# Patient Record
Sex: Male | Born: 1955 | Race: White | Hispanic: No | Marital: Single | Smoking: Current some day smoker
Health system: Southern US, Community
[De-identification: ages and names within clinical notes are randomized; demographics above are authoritative.]

## PROBLEM LIST (undated history)

## (undated) DIAGNOSIS — I10 Essential (primary) hypertension: Secondary | ICD-10-CM

## (undated) HISTORY — DX: Essential (primary) hypertension: I10

---

## 2020-12-29 ENCOUNTER — Ambulatory Visit (INDEPENDENT_AMBULATORY_CARE_PROVIDER_SITE_OTHER): Payer: 59 | Admitting: Family Medicine

## 2020-12-29 ENCOUNTER — Encounter: Payer: Self-pay | Admitting: Family Medicine

## 2020-12-29 ENCOUNTER — Other Ambulatory Visit: Payer: Self-pay

## 2020-12-29 VITALS — BP 133/89 | HR 82 | Temp 98.9°F | Resp 18 | Ht 73.03 in | Wt 275.2 lb

## 2020-12-29 DIAGNOSIS — E6609 Other obesity due to excess calories: Secondary | ICD-10-CM

## 2020-12-29 DIAGNOSIS — R739 Hyperglycemia, unspecified: Secondary | ICD-10-CM | POA: Diagnosis not present

## 2020-12-29 DIAGNOSIS — Z6836 Body mass index (BMI) 36.0-36.9, adult: Secondary | ICD-10-CM

## 2020-12-29 DIAGNOSIS — M25562 Pain in left knee: Secondary | ICD-10-CM | POA: Diagnosis not present

## 2020-12-29 DIAGNOSIS — Z7689 Persons encountering health services in other specified circumstances: Secondary | ICD-10-CM

## 2020-12-29 LAB — GLUCOSE, POCT (MANUAL RESULT ENTRY): POC Glucose: 109 mg/dl — AB (ref 70–99)

## 2020-12-29 LAB — POCT GLYCOSYLATED HEMOGLOBIN (HGB A1C): Hemoglobin A1C: 6.1 % — AB (ref 4.0–5.6)

## 2020-12-29 NOTE — Progress Notes (Signed)
Pt presents to establish care reports that he has not been to PCP in awhile, he has been having left knee intermittent pain req referral to Orthopedic

## 2020-12-29 NOTE — Progress Notes (Signed)
New Patient Office Visit  Subjective:  Patient ID: Casey Beasley, male    DOB: April 02, 1956  Age: 65 y.o. MRN: [id number]  CC:  Chief Complaint  Patient presents with   Establish Care    HPI Casey Beasley presents for to reestablish care.  Patient reports history of hyperglycemia.  Patient reports also that he tweaked his left knee playing golf several weeks ago.  Patient would like referral to Ortho as his left knee had ACL surgery over 20 years ago.  Past Medical History:  Diagnosis Date   Hypertension     History reviewed. No pertinent surgical history.  History reviewed. No pertinent family history.  Social History   Socioeconomic History   Marital status: Single    Spouse name: Not on file   Number of children: Not on file   Years of education: Not on file   Highest education level: Not on file  Occupational History   Not on file  Tobacco Use   Smoking status: Some Days    Types: Cigars   Smokeless tobacco: Never  Vaping Use   Vaping Use: Never used  Substance and Sexual Activity   Alcohol use: Yes    Alcohol/week: 2.0 standard drinks    Types: 1 Cans of beer, 1 Shots of liquor per week    Comment: moderately   Drug use: Never   Sexual activity: Not on file  Other Topics Concern   Not on file  Social History Narrative   Not on file   Social Determinants of Health   Financial Resource Strain: Not on file  Food Insecurity: Not on file  Transportation Needs: Not on file  Physical Activity: Not on file  Stress: Not on file  Social Connections: Not on file  Intimate Partner Violence: Not on file    ROS Review of Systems  Cardiovascular: Negative.   Musculoskeletal:  Negative for gait problem.  All other systems reviewed and are negative.  Objective:   Today's Vitals: BP 133/89 (BP Location: Left Arm, Patient Position: Sitting, Cuff Size: Large)   Pulse 82   Temp 98.9 F (37.2 C)   Resp 18   Ht 6' 1.03" (1.855 m)   Wt 275 lb 3.2 oz  (124.8 kg)   SpO2 92%   BMI 36.28 kg/m   Physical Exam Vitals and nursing note reviewed.  Constitutional:      General: He is not in acute distress. Cardiovascular:     Rate and Rhythm: Normal rate and regular rhythm.  Pulmonary:     Effort: Pulmonary effort is normal.     Breath sounds: Normal breath sounds.  Abdominal:     Palpations: Abdomen is soft.     Tenderness: There is no abdominal tenderness.  Musculoskeletal:     Right lower leg: No edema.     Left lower leg: No edema.  Neurological:     General: No focal deficit present.     Mental Status: He is alert and oriented to person, place, and time.    Assessment & Plan:   1. Hyperglycemia A1c is at goal continue to monitor - POCT glucose (manual entry) - POCT glycosylated hemoglobin (Hb A1C)  2. Class 2 obesity due to excess calories without serious comorbidity with body mass index (BMI) of 36.0 to 36.9 in adult Discussed dietary and  activity options.  Goal for patient will be 5 pounds per month of weight loss and will monitor.  3. Left knee pain, unspecified chronicity Referral to  orthopedic surgery for further evaluation and management. - Ambulatory referral to Orthopedic Surgery  4. Encounter to establish care     No outpatient encounter medications on file as of 12/29/2020.   No facility-administered encounter medications on file as of 12/29/2020.    Follow-up: Return in about 6 months (around 07/01/2021) for physical.   Tommie Raymond, MD

## 2021-01-01 ENCOUNTER — Ambulatory Visit (INDEPENDENT_AMBULATORY_CARE_PROVIDER_SITE_OTHER): Payer: 59

## 2021-01-01 ENCOUNTER — Other Ambulatory Visit: Payer: Self-pay

## 2021-01-01 ENCOUNTER — Encounter: Payer: Self-pay | Admitting: Orthopedic Surgery

## 2021-01-01 ENCOUNTER — Ambulatory Visit (INDEPENDENT_AMBULATORY_CARE_PROVIDER_SITE_OTHER): Payer: 59 | Admitting: Physician Assistant

## 2021-01-01 DIAGNOSIS — M25562 Pain in left knee: Secondary | ICD-10-CM

## 2021-01-01 DIAGNOSIS — G8929 Other chronic pain: Secondary | ICD-10-CM | POA: Diagnosis not present

## 2021-01-01 NOTE — Progress Notes (Signed)
Office Visit Note   Patient: Casey Beasley           Date of Birth: 18-Sep-1955           MRN: [id number] Visit Date: 01/01/2021              Requested by: Georganna Skeans, MD [address] [place],  [place] [address] PCP: Georganna Skeans, MD  Chief Complaint  Patient presents with   Left Knee - Pain      HPI: Patient is a very pleasant active 65 year old gentleman with a 2-week history of left knee pain after twisting while playing golf.  He had his ACL he believes was 25 1985 but he had a reconstruction in 2000.  He was told at that time that he did have some arthritis in his knee.  He actually did fairly well he takes over-the-counter ibuprofen once daily.  He said 2-1/2 weeks ago he twisted his knee funny and had pain on the medial and the medial patella border.  He had a little swelling but not a significant amount of swelling he was worried because he felt his knee was a little bit weak wanted to make sure he had read torn his ACL  Assessment & Plan: Visit Diagnoses:  1. Chronic pain of left knee     Plan: Had a long discussion with the patient today.  He is much better than he was 2 weeks ago.  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  We talked about options down the line if he became more symptomatic.  I discussed with him close chain strengthening of his lower leg muscles.  He is going to be going to the gym.  He also wants to drop about 30 pounds I told him this was should be helpful.  If he had more pain we could consider a steroid injection.  I do not think he would benefit from viscosupplementation or arthroscopy given the advanced Faylene Million nature of his arthritis.  At some point he may need knee replacement  Follow-Up Instructions: No follow-ups on file.   Ortho Exam  Patient is alert, oriented, no adenopathy, well-dressed, normal affect, normal respiratory effort. Examination of his knee he has very little patellar mobility.  He has some  pain around the medial patellar border medial joint line less on the lateral joint line he has grinding with range of motion although his range of motion is quite good given the considerable amount of arthritis he has in his knee he has a good endpoint on anterior draw good medial and lateral stability  Imaging: XR KNEE 3 VIEW LEFT  Result Date: 01/01/2021 3 views of his left knee were obtained today.  They show advanced arthritic changes tricompartmental.  He has small areas of broken osteophyte formation complete loss of joint space on the medial side  No images are attached to the encounter.  Labs: Lab Results  Component Value Date   HGBA1C 6.1 (A) 12/29/2020     No results found for: ALBUMIN, PREALBUMIN, CBC  No results found for: MG No results found for: VD25OH  No results found for: PREALBUMIN No flowsheet data found.   There is no height or weight on file to calculate BMI.  Orders:  Orders Placed This Encounter  Procedures   XR KNEE 3 VIEW LEFT   No orders of the defined types were placed in this encounter.    Procedures: No procedures performed  Clinical Data:  No additional findings.  ROS:  All other systems negative, except as noted in the HPI. Review of Systems  Objective: Vital Signs: There were no vitals taken for this visit.  Specialty Comments:  No specialty comments available.  PMFS History: There are no problems to display for this patient.  Past Medical History:  Diagnosis Date   Hypertension     History reviewed. No pertinent family history.  History reviewed. No pertinent surgical history. Social History   Occupational History   Not on file  Tobacco Use   Smoking status: Some Days    Types: Cigars   Smokeless tobacco: Never  Vaping Use   Vaping Use: Never used  Substance and Sexual Activity   Alcohol use: Yes    Alcohol/week: 2.0 standard drinks    Types: 1 Cans of beer, 1 Shots of liquor per week    Comment: moderately    Drug use: Never   Sexual activity: Not on file

## 2021-02-25 ENCOUNTER — Ambulatory Visit
Admission: EM | Admit: 2021-02-25 | Discharge: 2021-02-25 | Disposition: A | Discharge status: Home / Self Care | Payer: 59 | Attending: Emergency Medicine | Admitting: Emergency Medicine

## 2021-02-25 ENCOUNTER — Ambulatory Visit (HOSPITAL_BASED_OUTPATIENT_CLINIC_OR_DEPARTMENT_OTHER)
Admission: RE | Admit: 2021-02-25 | Discharge: 2021-02-25 | Disposition: A | Discharge status: Home / Self Care | Payer: 59 | Source: Ambulatory Visit | Attending: Emergency Medicine | Admitting: Emergency Medicine

## 2021-02-25 ENCOUNTER — Other Ambulatory Visit: Payer: Self-pay

## 2021-02-25 DIAGNOSIS — Y9354 Activity, bowling: Secondary | ICD-10-CM | POA: Insufficient documentation

## 2021-02-25 DIAGNOSIS — Y929 Unspecified place or not applicable: Secondary | ICD-10-CM | POA: Insufficient documentation

## 2021-02-25 DIAGNOSIS — W19XXXA Unspecified fall, initial encounter: Secondary | ICD-10-CM | POA: Diagnosis not present

## 2021-02-25 DIAGNOSIS — Y9239 Other specified sports and athletic area as the place of occurrence of the external cause: Secondary | ICD-10-CM | POA: Insufficient documentation

## 2021-02-25 DIAGNOSIS — S96911A Strain of unspecified muscle and tendon at ankle and foot level, right foot, initial encounter: Secondary | ICD-10-CM

## 2021-02-25 DIAGNOSIS — M19071 Primary osteoarthritis, right ankle and foot: Secondary | ICD-10-CM | POA: Insufficient documentation

## 2021-02-25 MED ORDER — NAPROXEN 500 MG PO TABS: 500.0000 mg | ORAL_TABLET | Freq: Two times a day (BID) | ORAL | 0 refills | Status: AC

## 2021-02-25 NOTE — Discharge Instructions (Addendum)
I agree with your current course of care which is to avoid weightbearing when possible and to keep foot elevated with ice as able.  I also recommend that you continue ibuprofen or Aleve for your pain, this will also help reduce inflammation and swelling.  I have ordered you an x-ray of your foot, location address has been provided to the end of this document.  We will notify you of the results once they are received.  Thank you for visiting urgent care.

## 2021-02-25 NOTE — ED Provider Notes (Addendum)
[facility]    CSN: [id number] Arrival date & time: 02/25/21  0903      History   Chief Complaint Chief Complaint  Patient presents with   Foot Pain    HPI Casey Beasley is a 65 y.o. male.   Patient complains of experiencing a popping sensation in his right foot when he was bowling last night.  Patient states that today, he feels a burning sensation when he is weightbearing on right foot.  Patient states after getting home from bowling, he elevated his foot and placed ice on it and took some ibuprofen, states this provided some relief.  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  Patient rates his pain 2 out of 10 when not walking or weightbearing and 7 or 8 out of 10 when he is.  Patient states that in ninth grade, he broke his left ankle, states that the pain he is feeling today is similar to the pain he had then.  The history is provided by the patient.   Past Medical History:  Diagnosis Date   Hypertension     There are no problems to display for this patient.   History reviewed. No pertinent surgical history.     Home Medications    Prior to Admission medications   Medication Sig Start Date End Date Taking? Authorizing Provider  naproxen (NAPROSYN) 500 MG tablet Take 1 tablet (500 mg total) by mouth 2 (two) times daily with a meal. 02/25/21 03/27/21 Yes Theadora Rama Scales, PA-C    Family History History reviewed. No pertinent family history.  Social History Social History   Tobacco Use   Smoking status: Some Days    Types: Cigars   Smokeless tobacco: Never  Vaping Use   Vaping Use: Never used  Substance Use Topics   Alcohol use: Yes    Alcohol/week: 2.0 standard drinks    Types: 1 Cans of beer, 1 Shots of liquor per week    Comment: moderately   Drug use: Never     Allergies   Patient has no known allergies.   Review of  Systems Review of Systems Pertinent findings noted in history of present illness.    Physical Exam Triage Vital Signs ED Triage Vitals  Enc Vitals Group     BP      Pulse      Resp      Temp      Temp src      SpO2      Weight      Height      Head Circumference      Peak Flow      Pain Score      Pain Loc      Pain Edu?      Excl. in GC?    No data found.  Updated Vital Signs BP (!) 142/92 (BP Location: Left Arm)   Pulse 79   Temp 97.9 F (36.6 C) (Oral)   Resp 20   Wt 283 lb 9.6 oz (128.6 kg)   SpO2 97%   BMI 37.38 kg/m   Visual Acuity Right Eye Distance:   Left Eye Distance:   Bilateral Distance:    Right Eye Near:   Left Eye Near:    Bilateral Near:     Physical Exam Vitals and nursing note reviewed.  Constitutional:  Appearance: Normal appearance.  HENT:     Head: Normocephalic and atraumatic.  Eyes:     Conjunctiva/sclera: Conjunctivae normal.  Cardiovascular:     Rate and Rhythm: Normal rate and regular rhythm.     Pulses:          Dorsalis pedis pulses are 2+ on the right side.       Posterior tibial pulses are 2+ on the right side.     Heart sounds: Normal heart sounds.  Pulmonary:     Effort: Pulmonary effort is normal.     Breath sounds: Normal breath sounds.  Abdominal:     General: Abdomen is flat. Bowel sounds are normal.     Palpations: Abdomen is soft.  Musculoskeletal:        General: Normal range of motion.     Right foot: Normal range of motion. No deformity, bunion, Charcot foot, foot drop or prominent metatarsal heads.     Comments: Tenderness to deep palpation along the cuboid and fifth metatarsal of right foot.  Pain could not be reproduced with inversion or eversion of foot  Feet:     Right foot:     Skin integrity: Skin integrity normal.     Toenail Condition: Right toenails are normal.  Skin:    General: Skin is warm and dry.  Neurological:     General: No focal deficit present.     Mental Status: He is alert  and oriented to person, place, and time.  Psychiatric:        Mood and Affect: Mood normal.        Behavior: Behavior normal.     UC Treatments / Results  Labs (all labs ordered are listed, but only abnormal results are displayed) Labs Reviewed - No data to display  EKG   Radiology No results found.  Procedures Procedures (including critical care time)  Medications Ordered in UC Medications - No data to display  Initial Impression / Assessment and Plan / UC Course  I have reviewed the triage vital signs and the nursing notes.  Pertinent labs & imaging results that were available during my care of the patient were reviewed by me and considered in my medical decision making (see chart for details).     Patient advised that he is most likely sprained his foot and that stability is recommended.  Patient is requesting an x-ray right foot, were happy to provide that for him.  Patient also advised to continue ice and ibuprofen as able when not working and to avoid weightbearing whenever possible.  Patient also advised that duration of recovery is usually 4 to 6 weeks.  Patient verbalized understanding and agreement of plan as discussed.  All questions were addressed during visit.  Please see discharge instructions below for further details of plan.  Final Clinical Impressions(s) / UC Diagnoses   Final diagnoses:  Right foot strain, initial encounter     Discharge Instructions      I agree with your current course of care which is to avoid weightbearing when possible and to keep foot elevated with ice as able.  I also recommend that you continue ibuprofen or Aleve for your pain, this will also help reduce inflammation and swelling.  I have ordered you an x-ray of your foot, location address has been provided to the end of this document.  We will notify you of the results once they are received.  Thank you for visiting urgent care.     ED  Prescriptions     Medication Sig  Dispense Auth. Provider   naproxen (NAPROSYN) 500 MG tablet Take 1 tablet (500 mg total) by mouth 2 (two) times daily with a meal. 60 tablet Theadora Rama Scales, PA-C      PDMP not reviewed this encounter.   Theadora Rama Scales, PA-C 02/25/21 6754    Theadora Rama Scales, PA-C 02/25/21 1019

## 2021-02-25 NOTE — ED Triage Notes (Signed)
Patient reports while bowling last night he felt a pop in his right foot. Patient states there is a burning sensation when pressure is applied. r

## 2021-07-01 ENCOUNTER — Ambulatory Visit (INDEPENDENT_AMBULATORY_CARE_PROVIDER_SITE_OTHER): Payer: 59 | Admitting: Family Medicine

## 2021-07-01 ENCOUNTER — Encounter: Payer: Self-pay | Admitting: Family Medicine

## 2021-07-01 ENCOUNTER — Other Ambulatory Visit: Payer: Self-pay

## 2021-07-01 VITALS — BP 124/83 | HR 79 | Temp 98.1°F | Resp 16 | Wt 277.2 lb

## 2021-07-01 DIAGNOSIS — Z0000 Encounter for general adult medical examination without abnormal findings: Secondary | ICD-10-CM

## 2021-07-01 DIAGNOSIS — Z1329 Encounter for screening for other suspected endocrine disorder: Secondary | ICD-10-CM

## 2021-07-01 DIAGNOSIS — Z1159 Encounter for screening for other viral diseases: Secondary | ICD-10-CM

## 2021-07-01 DIAGNOSIS — Z114 Encounter for screening for human immunodeficiency virus [HIV]: Secondary | ICD-10-CM

## 2021-07-01 DIAGNOSIS — Z125 Encounter for screening for malignant neoplasm of prostate: Secondary | ICD-10-CM

## 2021-07-01 DIAGNOSIS — Z13220 Encounter for screening for lipoid disorders: Secondary | ICD-10-CM

## 2021-07-01 DIAGNOSIS — Z130 Encounter for screening for diseases of the blood and blood-forming organs and certain disorders involving the immune mechanism: Secondary | ICD-10-CM

## 2021-07-01 DIAGNOSIS — Z1211 Encounter for screening for malignant neoplasm of colon: Secondary | ICD-10-CM

## 2021-07-01 NOTE — Progress Notes (Signed)
Patient is here for CPE.  Patient would like to talk with provider about getting a colonoscopy. Patient has no other concerns

## 2021-07-01 NOTE — Progress Notes (Signed)
Established Patient Office Visit  Subjective:  Patient ID: Casey Beasley, male    DOB: 1955-06-23  Age: 66 y.o. MRN: [id number]  CC:  Chief Complaint  Patient presents with   Annual Exam    HPI Casey Beasley presents for routine annual exam. Patient denies acute complaints or concerns.   Past Medical History:  Diagnosis Date   Hypertension     No past surgical history on file.  No family history on file.  Social History   Socioeconomic History   Marital status: Single    Spouse name: Not on file   Number of children: Not on file   Years of education: Not on file   Highest education level: Not on file  Occupational History   Not on file  Tobacco Use   Smoking status: Some Days    Types: Cigars   Smokeless tobacco: Never  Vaping Use   Vaping Use: Never used  Substance and Sexual Activity   Alcohol use: Yes    Alcohol/week: 2.0 standard drinks    Types: 1 Cans of beer, 1 Shots of liquor per week    Comment: moderately   Drug use: Never   Sexual activity: Not on file  Other Topics Concern   Not on file  Social History Narrative   Not on file   Social Determinants of Health   Financial Resource Strain: Not on file  Food Insecurity: Not on file  Transportation Needs: Not on file  Physical Activity: Not on file  Stress: Not on file  Social Connections: Not on file  Intimate Partner Violence: Not on file    ROS Review of Systems  All other systems reviewed and are negative.  Objective:   Today's Vitals: BP 124/83    Pulse 79    Temp 98.1 F (36.7 C) (Oral)    Resp 16    Wt 277 lb 3.2 oz (125.7 kg)    SpO2 96%    BMI 36.54 kg/m   Physical Exam Vitals and nursing note reviewed.  Constitutional:      General: He is not in acute distress. HENT:     Head: Normocephalic and atraumatic.     Right Ear: Tympanic membrane, ear canal and external ear normal.     Left Ear: Tympanic membrane, ear canal and external ear normal.     Nose: Nose normal.      Mouth/Throat:     Mouth: Mucous membranes are moist.     Pharynx: Oropharynx is clear.  Eyes:     Conjunctiva/sclera: Conjunctivae normal.     Pupils: Pupils are equal, round, and reactive to light.  Neck:     Thyroid: No thyromegaly.  Cardiovascular:     Rate and Rhythm: Normal rate and regular rhythm.     Heart sounds: Normal heart sounds. No murmur heard. Pulmonary:     Effort: Pulmonary effort is normal.     Breath sounds: Normal breath sounds.  Abdominal:     General: There is no distension.     Palpations: Abdomen is soft. There is no mass.     Tenderness: There is no abdominal tenderness.     Hernia: There is no hernia in the left inguinal area or right inguinal area.  Genitourinary:    Penis: Normal and circumcised.      Testes: Normal.  Musculoskeletal:        General: Normal range of motion.     Cervical back: Normal range of motion and neck supple.  Right lower leg: No edema.     Left lower leg: No edema.  Skin:    General: Skin is warm and dry.  Neurological:     General: No focal deficit present.     Mental Status: He is alert and oriented to person, place, and time. Mental status is at baseline.  Psychiatric:        Mood and Affect: Mood normal.        Behavior: Behavior normal.    Assessment & Plan:    1. Annual physical exam Routine labs ordered - CMP14+EGFR  2. Screening for colon cancer Referral for cologuard - Cologuard  3. Screening for endocrine/metabolic/immunity disorders  - Hemoglobin A1c  4. Screening for deficiency anemia  - CBC with Differential  5. Screening for HIV (human immunodeficiency virus)  - HIV antibody (with reflex)  6. Need for hepatitis C screening test  - Hepatitis C Antibody  7. Screening for prostate cancer  - PSA  8. Need for lipid screening  - Lipid Panel   No outpatient encounter medications on file as of 07/01/2021.   No facility-administered encounter medications on file as of 07/01/2021.     Follow-up: No follow-ups on file.   Becky Sax, MD

## 2021-07-02 LAB — CMP14+EGFR
ALT: 73 IU/L — ABNORMAL HIGH (ref 0–44)
AST: 54 IU/L — ABNORMAL HIGH (ref 0–40)
Albumin/Globulin Ratio: 1.5 (ref 1.2–2.2)
Albumin: 4.4 g/dL (ref 3.8–4.8)
Alkaline Phosphatase: 62 IU/L (ref 44–121)
BUN/Creatinine Ratio: 14 (ref 10–24)
BUN: 14 mg/dL (ref 8–27)
Bilirubin Total: 0.8 mg/dL (ref 0.0–1.2)
CO2: 25 mmol/L (ref 20–29)
Calcium: 9.8 mg/dL (ref 8.6–10.2)
Chloride: 99 mmol/L (ref 96–106)
Creatinine, Ser: 0.99 mg/dL (ref 0.76–1.27)
Globulin, Total: 2.9 g/dL (ref 1.5–4.5)
Glucose: 114 mg/dL — ABNORMAL HIGH (ref 70–99)
Potassium: 4.7 mmol/L (ref 3.5–5.2)
Sodium: 139 mmol/L (ref 134–144)
Total Protein: 7.3 g/dL (ref 6.0–8.5)
eGFR: 86 mL/min/{1.73_m2} (ref >59)

## 2021-07-02 LAB — LIPID PANEL
Chol/HDL Ratio: 4.2 ratio (ref 0.0–5.0)
Cholesterol, Total: 207 mg/dL — ABNORMAL HIGH (ref 100–199)
HDL: 49 mg/dL (ref >39)
LDL Chol Calc (NIH): 135 mg/dL — ABNORMAL HIGH (ref 0–99)
Triglycerides: 127 mg/dL (ref 0–149)
VLDL Cholesterol Cal: 23 mg/dL (ref 5–40)

## 2021-07-02 LAB — CBC WITH DIFFERENTIAL/PLATELET
Basophils Absolute: 0.1 10*3/uL (ref 0.0–0.2)
Basos: 1 %
EOS (ABSOLUTE): 0.2 10*3/uL (ref 0.0–0.4)
Eos: 3 %
Hematocrit: 49.4 % (ref 37.5–51.0)
Hemoglobin: 16.4 g/dL (ref 13.0–17.7)
Immature Grans (Abs): 0 10*3/uL (ref 0.0–0.1)
Immature Granulocytes: 0 %
Lymphocytes Absolute: 1.7 10*3/uL (ref 0.7–3.1)
Lymphs: 26 %
MCH: 29.2 pg (ref 26.6–33.0)
MCHC: 33.2 g/dL (ref 31.5–35.7)
MCV: 88 fL (ref 79–97)
Monocytes Absolute: 0.6 10*3/uL (ref 0.1–0.9)
Monocytes: 10 %
Neutrophils Absolute: 4 10*3/uL (ref 1.4–7.0)
Neutrophils: 60 %
Platelets: 253 10*3/uL (ref 150–450)
RBC: 5.61 x10E6/uL (ref 4.14–5.80)
RDW: 13.1 % (ref 11.6–15.4)
WBC: 6.6 10*3/uL (ref 3.4–10.8)

## 2021-07-02 LAB — PSA: Prostate Specific Ag, Serum: 1.2 ng/mL (ref 0.0–4.0)

## 2021-07-02 LAB — HIV ANTIBODY (ROUTINE TESTING W REFLEX): HIV Screen 4th Generation wRfx: NONREACTIVE

## 2021-07-02 LAB — HEPATITIS C ANTIBODY: Hep C Virus Ab: NONREACTIVE

## 2021-07-02 LAB — HEMOGLOBIN A1C
Est. average glucose Bld gHb Est-mCnc: 120 mg/dL
Hgb A1c MFr Bld: 5.8 % — ABNORMAL HIGH (ref 4.8–5.6)

## 2021-07-22 LAB — COLOGUARD: COLOGUARD: NEGATIVE

## 2021-10-05 ENCOUNTER — Encounter (HOSPITAL_COMMUNITY): Payer: Self-pay

## 2021-10-05 ENCOUNTER — Emergency Department (HOSPITAL_BASED_OUTPATIENT_CLINIC_OR_DEPARTMENT_OTHER): Payer: 59

## 2021-10-05 ENCOUNTER — Emergency Department (HOSPITAL_COMMUNITY)
Admission: EM | Admit: 2021-10-05 | Discharge: 2021-10-05 | Disposition: A | Discharge status: Home / Self Care | Payer: 59 | Attending: Emergency Medicine | Admitting: Emergency Medicine

## 2021-10-05 DIAGNOSIS — M79662 Pain in left lower leg: Secondary | ICD-10-CM | POA: Insufficient documentation

## 2021-10-05 DIAGNOSIS — R202 Paresthesia of skin: Secondary | ICD-10-CM | POA: Insufficient documentation

## 2021-10-05 DIAGNOSIS — M7989 Other specified soft tissue disorders: Secondary | ICD-10-CM | POA: Diagnosis not present

## 2021-10-05 DIAGNOSIS — M79669 Pain in unspecified lower leg: Secondary | ICD-10-CM

## 2021-10-05 NOTE — Progress Notes (Signed)
LLE venous duplex has been completed.  Preliminary results given to Dr. Charm Barges through secure chat.   Results can be found under chart review under CV PROC. 10/05/2021 10:56 AM Yosiel Thieme RVT, RDMS

## 2021-10-05 NOTE — ED Triage Notes (Signed)
Pt arrived via POV, c/o left calf pain, and swelling for about a week and a half. States worsening with walking. Concerned for blood clot. 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

## 2021-10-05 NOTE — ED Notes (Signed)
US at bedside

## 2021-10-05 NOTE — Discharge Instructions (Signed)
You were seen in the emergency department for left calf pain.  You had an ultrasound that did not show any evidence of a DVT or blood clot.  You can try a warm compress to the area and gentle stretching.  Follow-up with your primary care doctor.  Return to the emergency department if any worsening or concerning symptoms.

## 2021-10-05 NOTE — ED Provider Notes (Signed)
[facility] DEPT Provider Note   CSN: [id number] Arrival date & time: 10/05/21  0746     History  Chief Complaint  Patient presents with   Leg Pain    Casey Beasley is a 66 y.o. male.  He has no significant past medical history.  Complaining of left calf pain x2 weeks.  No known trauma.  Worse with ambulation.  Also has noticed some swelling.  Has had intermittent tingling for the last 3 days.  No fevers chills chest pain shortness of breath.  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  Concern for DVT.  No prior history of blood clot  The history is provided by the patient.  Leg Pain Location:  Leg Time since incident:  2 weeks Injury: no   Leg location:  L lower leg Pain details:    Quality:  Aching   Radiates to:  Does not radiate   Severity:  Mild   Onset quality:  Gradual   Duration:  2 weeks   Timing:  Intermittent   Progression:  Unchanged Chronicity:  New Dislocation: no   Prior injury to area:  No Relieved by:  None tried Worsened by:  Activity Ineffective treatments:  None tried Associated symptoms: swelling and tingling   Associated symptoms: no decreased ROM, no fever and no muscle weakness       Home Medications Prior to Admission medications   Not on File      Allergies    Patient has no known allergies.    Review of Systems   Review of Systems  Constitutional:  Negative for fever.  Respiratory:  Negative for shortness of breath.   Cardiovascular:  Negative for chest pain.  Musculoskeletal:  Negative for gait problem.  Skin:  Negative for rash.  Neurological:  Negative for weakness.   Physical Exam Updated Vital Signs BP (!) 142/86   Pulse 66   Temp 98 F (36.7 C) (Oral)   Resp 17   SpO2 96%  Physical Exam Vitals and nursing note reviewed.  Constitutional:      Appearance: Normal appearance. He is well-developed.  HENT:     Head: Normocephalic and atraumatic.  Eyes:      Conjunctiva/sclera: Conjunctivae normal.  Pulmonary:     Effort: Pulmonary effort is normal.  Musculoskeletal:        General: Swelling and tenderness present. Normal range of motion.     Cervical back: Neck supple.     Comments: There is trace edema of his left lower leg.  There are some tenderness mid calf with a little bit of induration.  No overlying skin changes.  No cords appreciated.  Distal pulses sensation and motor intact.  Skin:    General: Skin is warm and dry.  Neurological:     General: No focal deficit present.     Mental Status: He is alert.     GCS: GCS eye subscore is 4. GCS verbal subscore is 5. GCS motor subscore is 6.     Sensory: No sensory deficit.     Motor: No weakness.    ED Results / Procedures / Treatments   Labs (all labs ordered are listed, but only abnormal results are displayed) Labs Reviewed - No data to display  EKG None  Radiology VAS [place] LOWER EXTREMITY VENOUS (DVT) (7a-7p)  Result Date: 10/05/2021  Lower Venous DVT Study Patient Name:  Casey Beasley  Date of Exam:   10/05/2021 Medical Rec #: [id number]  Accession #:    [id number] Date of Birth: 1956-03-08       Patient Gender: M Patient Age:   62 years Exam Location:  [facility] Procedure:      VAS [place] LOWER EXTREMITY VENOUS (DVT) Referring Phys: Williette Loewe --------------------------------------------------------------------------------  Indications: Left calf pain & swelling for a week and a half.  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 Comparison Study: No previous exams Performing Technologist: Jody Hill RVT, RDMS  Examination Guidelines: A complete evaluation includes B-mode imaging, spectral Doppler, color Doppler, and power Doppler as needed of all accessible portions of each vessel. Bilateral testing is considered an integral part of a complete examination. Limited examinations for reoccurring indications may be performed as noted. The reflux portion of the  exam is performed with the patient in reverse Trendelenburg.  +-----+---------------+---------+-----------+----------+--------------+ RIGHTCompressibilityPhasicitySpontaneityPropertiesThrombus Aging +-----+---------------+---------+-----------+----------+--------------+ CFV  Full           Yes      Yes                                 +-----+---------------+---------+-----------+----------+--------------+   +---------+---------------+---------+-----------+----------+--------------+ LEFT     CompressibilityPhasicitySpontaneityPropertiesThrombus Aging +---------+---------------+---------+-----------+----------+--------------+ CFV      Full           Yes      Yes                                 +---------+---------------+---------+-----------+----------+--------------+ SFJ      Full                                                        +---------+---------------+---------+-----------+----------+--------------+ FV Prox  Full           Yes      Yes                                 +---------+---------------+---------+-----------+----------+--------------+ FV Mid   Full           Yes      Yes                                 +---------+---------------+---------+-----------+----------+--------------+ FV DistalFull           Yes      Yes                                 +---------+---------------+---------+-----------+----------+--------------+ PFV      Full                                                        +---------+---------------+---------+-----------+----------+--------------+ POP      Full           Yes      Yes                                 +---------+---------------+---------+-----------+----------+--------------+  PTV      Full                                                        +---------+---------------+---------+-----------+----------+--------------+ PERO     Full                                                         +---------+---------------+---------+-----------+----------+--------------+ Well defined, anechoic area seen in medial mid-calf measuring 2.26 x 0.95 x 1.14 cm. Subcutaneous edema noted in area of left distal calf and ankle.   Summary: RIGHT: - No evidence of common femoral vein obstruction.  LEFT: - There is no evidence of deep vein thrombosis in the lower extremity. - There is no evidence of superficial venous thrombosis.  - No cystic structure found in the popliteal fossa.  *See table(s) above for measurements and observations. Electronically signed by Monica Martinez MD on 10/05/2021 at 5:14:07 PM.    Final     Procedures Procedures    Medications Ordered in ED Medications - No data to display  ED Course/ Medical Decision Making/ A&P Clinical Course as of 10/05/21 1856  Mon Oct 05, 2021  1001 Pulmonary reading of duplex shows no evidence of DVT. [MB]    Clinical Course User Index [MB] Hayden Rasmussen, MD                           Medical Decision Making  Differential diagnosis includes muscle strain, tear, contusion, hematoma, DVT.  Imaging does not show any evidence of DVT or Baker's cyst.  Reviewed with patient.  Recommended symptomatic treatment and outpatient follow-up with his PCP.  Return instructions discussed        Final Clinical Impression(s) / ED Diagnoses Final diagnoses:  Calf pain, unspecified laterality    Rx / DC Orders ED Discharge Orders     None         Hayden Rasmussen, MD 10/05/21 1857

## 2021-12-14 ENCOUNTER — Ambulatory Visit: Payer: Self-pay

## 2021-12-14 NOTE — Telephone Encounter (Signed)
Summary: swollen knee   Right knee swollen for  7 days, retaining fluid.      Chief Complaint: right knee pain and swollen Symptoms: pain, swelling Frequency: 7 days  Pertinent Negatives: Patient denies redness, calf pain, chest pain Disposition: [] ED /[x] Urgent Care (no appt availability in office) / [] Appointment(In office/virtual)/ []  [facility]/ [] Home Care/ [] Refused Recommended Disposition /[] [facility]/ []  Follow-up with PCP Additional Notes: Provided pt phone number and address to [facility].  Reason for Disposition  [1] Swollen joint AND [2] no fever or redness  Answer Assessment - Initial Assessment Questions 1. LOCATION and RADIATION: "Where is the pain located?"      On or just below the knee cap- right knee 2. QUALITY: "What does the pain feel like?"  (e.g., sharp, dull, aching, burning)     Sharp- dull ache 3. SEVERITY: "How bad is the pain?" "What does it keep you from doing?"   (Scale 1-10; or mild, moderate, severe)   -  MILD (1-3): doesn't interfere with normal activities    -  MODERATE (4-7): interferes with normal activities (e.g., work or school) or awakens from sleep, limping    -  SEVERE (8-10): excruciating pain, unable to do any normal activities, unable to walk     2-3 at rest-- sharp burning pain with walking 8-9 4. ONSET: "When did the pain start?" "Does it come and go, or is it there all the time?"     7 days ago 5. RECURRENT: "Have you had this pain before?" If Yes, ask: "When, and what happened then?"     ACL repair , arthroscopy,  6. SETTING: "Has there been any recent work, exercise or other activity that involved that part of the body?"      36 hole of golf in 2 days  2 days noted swelling  7. AGGRAVATING FACTORS: "What makes the knee pain worse?" (e.g., walking, climbing stairs, running)     walking 8. ASSOCIATED SYMPTOMS: "Is there any swelling or redness of the knee?"     Swelling to knee 9. OTHER SYMPTOMS: "Do you have  any other symptoms?" (e.g., chest pain, difficulty breathing, fever, calf pain)     no  Protocols used: Knee Pain-A-AH

## 2023-04-18 ENCOUNTER — Ambulatory Visit: Payer: Medicare Other

## 2023-04-18 ENCOUNTER — Ambulatory Visit
Admission: EM | Admit: 2023-04-18 | Discharge: 2023-04-18 | Disposition: A | Discharge status: Home / Self Care | Payer: Medicare Other | Attending: Internal Medicine | Admitting: Internal Medicine

## 2023-04-18 DIAGNOSIS — R030 Elevated blood-pressure reading, without diagnosis of hypertension: Secondary | ICD-10-CM | POA: Diagnosis not present

## 2023-04-18 DIAGNOSIS — M179 Osteoarthritis of knee, unspecified: Secondary | ICD-10-CM

## 2023-04-18 DIAGNOSIS — M1711 Unilateral primary osteoarthritis, right knee: Secondary | ICD-10-CM | POA: Diagnosis not present

## 2023-04-18 MED ORDER — MELOXICAM 7.5 MG PO TABS: 7.5000 mg | ORAL_TABLET | Freq: Every day | ORAL | 0 refills | Status: AC | PRN

## 2023-04-18 NOTE — ED Provider Notes (Signed)
[name]    CSN: [id number] Arrival date & time: 04/18/23  0905      History   Chief Complaint Chief Complaint  Patient presents with   Knee Pain    Right    HPI Casey Beasley is a 67 y.o. male.   67 year old male pt, Casey Beasley, presents to urgent care for evaluation of right knee pain that started over the weekend.  Patient states he recently played golf no known injury however the pain is recurrent reports is a dull ache and sometimes a shooting pain.  Patient reports some swelling and decreased range of motion in her right knee.  The history is provided by the patient. No language interpreter was used.    Past Medical History:  Diagnosis Date   Hypertension     Patient Active Problem List   Diagnosis Date Noted   Osteoarthritis of right knee 04/18/2023   Elevated blood pressure reading 04/18/2023    History reviewed. No pertinent surgical history.     Home Medications    Prior to Admission medications   Medication Sig Start Date End Date Taking? Authorizing Provider  meloxicam (MOBIC) 7.5 MG tablet Take 1 tablet (7.5 mg total) by mouth daily as needed for up to 5 days for pain. 04/18/23 04/23/23 Yes Brittnay Pigman, Para March, NP    Family History History reviewed. No pertinent family history.  Social History Social History   Tobacco Use   Smoking status: Some Days    Types: Cigars   Smokeless tobacco: Never  Vaping Use   Vaping status: Never Used  Substance Use Topics   Alcohol use: Yes    Alcohol/week: 2.0 standard drinks of alcohol    Types: 1 Cans of beer, 1 Shots of liquor per week    Comment: moderately   Drug use: Never     Allergies   Patient has no known allergies.   Review of Systems Review of Systems  Constitutional:  Negative for fever.  Musculoskeletal:  Positive for arthralgias, joint swelling and myalgias.  Skin: Negative.   All other systems reviewed and are negative.    Physical Exam Triage Vital  Signs ED Triage Vitals  Encounter Vitals Group     BP 04/18/23 0954 (!) 144/83     Systolic BP Percentile --      Diastolic BP Percentile --      Pulse --      Resp --      Temp 04/18/23 0954 98.5 F (36.9 C)     Temp Source 04/18/23 0954 Oral     SpO2 04/18/23 0954 96 %     Weight 04/18/23 0952 260 lb (117.9 kg)     Height 04/18/23 0952 6\' 2"  (1.88 m)     Head Circumference --      Peak Flow --      Pain Score 04/18/23 0948 3     Pain Loc --      Pain Education --      Exclude from Growth Chart --    No data found.  Updated Vital Signs BP (!) 144/83 (BP Location: Left Arm)   Temp 98.5 F (36.9 C) (Oral)   Ht 6\' 2"  (1.88 m)   Wt 260 lb (117.9 kg)   SpO2 96%   BMI 33.38 kg/m   Visual Acuity Right Eye Distance:   Left Eye Distance:   Bilateral Distance:    Right Eye Near:   Left Eye Near:    Bilateral  Near:     Physical Exam Vitals and nursing note reviewed.  Musculoskeletal:     Right knee: Swelling and bony tenderness present. Normal pulse.     Comments: No laxity, no erythema, full ROM. Pain with  palpation of patella region. No bruising.   Neurological:     General: No focal deficit present.     Mental Status: He is alert and oriented to person, place, and time.     GCS: GCS eye subscore is 4. GCS verbal subscore is 5. GCS motor subscore is 6.     Cranial Nerves: No cranial nerve deficit.     Sensory: No sensory deficit.  Psychiatric:        Attention and Perception: Attention normal.        Mood and Affect: Mood normal.        Speech: Speech normal.        Behavior: Behavior normal.      UC Treatments / Results  Labs (all labs ordered are listed, but only abnormal results are displayed) Labs Reviewed - No data to display  EKG   Radiology DG Knee Complete 4 Views Right  Result Date: 04/18/2023 CLINICAL DATA:  Pain, swelling, decreased range of motion status post possible injury EXAM: RIGHT KNEE - COMPLETE 4+ VIEW COMPARISON:  None available  FINDINGS: Mild degenerative changes of the medial and lateral compartments. Moderate degenerative changes of the patellofemoral compartment. No fracture or dislocation. Soft tissues are unremarkable. IMPRESSION: No acute abnormality of the right knee. Electronically Signed   By: Acquanetta Belling M.D.   On: 04/18/2023 10:40    Procedures Procedures (including critical care time)  Medications Ordered in UC Medications - No data to display  Initial Impression / Assessment and Plan / UC Course  I have reviewed the triage vital signs and the nursing notes.  Pertinent labs & imaging results that were available during my care of the patient were reviewed by me and considered in my medical decision making (see chart for details).    Discussed exam findings and plan of care with patient, strict go to ER precautions given.   Patient verbalized understanding to this provider.  Ddx: OA of right knee, sprain,strain,musculoskeletal pain Final Clinical Impressions(s) / UC Diagnoses   Final diagnoses:  Osteoarthritis of right knee, unspecified osteoarthritis type  Elevated blood pressure reading     Discharge Instructions      Your xray of right knee shows degenerative changes of knee(osteoarthritis).   May use voltaren gel as label directed, or biofreeze, or lidocaine gel as label directed for pain. Take meloxicam as directed.  Follow up with Orthopedics of your choice-call for appt. Wear neoprene sleeve for comfort/support, rest,ice,elevate right leg.      ED Prescriptions     Medication Sig Dispense Auth. Provider   meloxicam (MOBIC) 7.5 MG tablet Take 1 tablet (7.5 mg total) by mouth daily as needed for up to 5 days for pain. 5 tablet Gredmarie Delange, Para March, NP      PDMP not reviewed this encounter.   Clancy Gourd, NP 04/18/23 1050

## 2023-04-18 NOTE — ED Triage Notes (Signed)
"  I am having right knee pain over the weekend and spent Saturday and yesterday with it elevated and ice as needed". The pain is recurrent (dull ache) & sometimes a shooting pain. No known injury "I do play golf and recently Thanksgiving but not sure". "I notice some swelling, decreased ROM". No known arthritis in "this knee".

## 2023-04-18 NOTE — Discharge Instructions (Addendum)
Your xray of right knee shows degenerative changes of knee(osteoarthritis).   May use voltaren gel as label directed, or biofreeze, or lidocaine gel as label directed for pain. Take meloxicam as directed.  Follow up with Orthopedics of your choice-call for appt. Wear neoprene sleeve for comfort/support, rest,ice,elevate right leg.

## 2023-04-20 ENCOUNTER — Encounter: Payer: Medicare Other | Admitting: Family Medicine

## 2023-05-04 ENCOUNTER — Ambulatory Visit (INDEPENDENT_AMBULATORY_CARE_PROVIDER_SITE_OTHER): Payer: Medicare Other | Admitting: Family Medicine

## 2023-05-04 ENCOUNTER — Encounter: Payer: Self-pay | Admitting: Family Medicine

## 2023-05-04 VITALS — BP 129/86 | HR 81 | Temp 98.5°F | Resp 18 | Ht 74.0 in | Wt 272.4 lb

## 2023-05-04 DIAGNOSIS — Z1329 Encounter for screening for other suspected endocrine disorder: Secondary | ICD-10-CM

## 2023-05-04 DIAGNOSIS — Z0000 Encounter for general adult medical examination without abnormal findings: Secondary | ICD-10-CM

## 2023-05-04 DIAGNOSIS — Z13228 Encounter for screening for other metabolic disorders: Secondary | ICD-10-CM

## 2023-05-04 DIAGNOSIS — Z13220 Encounter for screening for lipoid disorders: Secondary | ICD-10-CM

## 2023-05-04 DIAGNOSIS — Z130 Encounter for screening for diseases of the blood and blood-forming organs and certain disorders involving the immune mechanism: Secondary | ICD-10-CM | POA: Diagnosis not present

## 2023-05-04 NOTE — Progress Notes (Signed)
Established Patient Office Visit  Subjective    Patient ID: Casey Beasley, male    DOB: 10-05-1955  Age: 67 y.o. MRN: [id number]  CC:  Chief Complaint  Patient presents with   Annual Exam    HPI Casey Beasley presents for routine annual exam. Patient denies known chronic med issues and denies acute complaints.   No outpatient encounter medications on file as of 05/04/2023.   No facility-administered encounter medications on file as of 05/04/2023.    Past Medical History:  Diagnosis Date   Hypertension     History reviewed. No pertinent surgical history.  History reviewed. No pertinent family history.  Social History   Socioeconomic History   Marital status: Single    Spouse name: Not on file   Number of children: Not on file   Years of education: Not on file   Highest education level: Not on file  Occupational History   Not on file  Tobacco Use   Smoking status: Some Days    Types: Cigars   Smokeless tobacco: Never  Vaping Use   Vaping status: Never Used  Substance and Sexual Activity   Alcohol use: Yes    Alcohol/week: 2.0 standard drinks of alcohol    Types: 1 Cans of beer, 1 Shots of liquor per week    Comment: moderately   Drug use: Never   Sexual activity: Not Currently  Other Topics Concern   Not on file  Social History Narrative   Not on file   Social Drivers of Health   Financial Resource Strain: Low Risk  (05/04/2023)   Overall Financial Resource Strain (CARDIA)    Difficulty of Paying Living Expenses: Not hard at all  Food Insecurity: No Food Insecurity (05/04/2023)   Hunger Vital Sign    Worried About Running Out of Food in the Last Year: Never true    Ran Out of Food in the Last Year: Never true  Transportation Needs: No Transportation Needs (05/04/2023)   PRAPARE - Administrator, Civil Service (Medical): No    Lack of Transportation (Non-Medical): No  Physical Activity: Inactive (05/04/2023)   Exercise Vital Sign     Days of Exercise per Week: 0 days    Minutes of Exercise per Session: 0 min  Stress: No Stress Concern Present (05/04/2023)   [employer] of Occupational Health - Occupational Stress Questionnaire    Feeling of Stress : Not at all  Social Connections: Socially Integrated (05/04/2023)   Social Connection and Isolation Panel [NHANES]    Frequency of Communication with Friends and Family: More than three times a week    Frequency of Social Gatherings with Friends and Family: Once a week    Attends Religious Services: 1 to 4 times per year    Active Member of [employer] or Organizations: Yes    Attends Banker Meetings: 1 to 4 times per year    Marital Status: Living with partner  Intimate Partner Violence: Not At Risk (05/04/2023)   Humiliation, Afraid, Rape, and Kick questionnaire    Fear of Current or Ex-Partner: No    Emotionally Abused: No    Physically Abused: No    Sexually Abused: No    Review of Systems  All other systems reviewed and are negative.       Objective    BP 129/86 (BP Location: Right Arm, Patient Position: Sitting)   Pulse 81   Temp 98.5 F (36.9 C) (Oral)   Resp 18  Ht 6\' 2"  (1.88 m)   Wt 272 lb 6.4 oz (123.6 kg)   SpO2 94%   BMI 34.97 kg/m   Physical Exam Vitals and nursing note reviewed.  Constitutional:      General: He is not in acute distress. HENT:     Head: Normocephalic and atraumatic.     Right Ear: Tympanic membrane, ear canal and external ear normal.     Left Ear: Tympanic membrane, ear canal and external ear normal.     Nose: Nose normal.     Mouth/Throat:     Mouth: Mucous membranes are moist.     Pharynx: Oropharynx is clear.  Eyes:     Conjunctiva/sclera: Conjunctivae normal.     Pupils: Pupils are equal, round, and reactive to light.  Neck:     Thyroid: No thyromegaly.  Cardiovascular:     Rate and Rhythm: Normal rate and regular rhythm.     Heart sounds: Normal heart sounds. No murmur heard. Pulmonary:      Effort: Pulmonary effort is normal.     Breath sounds: Normal breath sounds.  Abdominal:     General: There is no distension.     Palpations: Abdomen is soft. There is no mass.     Tenderness: There is no abdominal tenderness.     Hernia: There is no hernia in the left inguinal area or right inguinal area.  Musculoskeletal:        General: Normal range of motion.     Cervical back: Normal range of motion and neck supple.     Right lower leg: No edema.     Left lower leg: No edema.  Skin:    General: Skin is warm and dry.  Neurological:     General: No focal deficit present.     Mental Status: He is alert and oriented to person, place, and time. Mental status is at baseline.  Psychiatric:        Mood and Affect: Mood normal.        Behavior: Behavior normal.         Assessment & Plan:   Annual physical exam -     CMP14+EGFR  Screening for deficiency anemia  Screening for lipid disorders -     Lipid panel  Screening for endocrine/metabolic/immunity disorders     Return in about 6 months (around 11/02/2023) for follow up.   Tommie Raymond, MD

## 2023-05-05 LAB — CMP14+EGFR
ALT: 31 [IU]/L (ref 0–44)
AST: 27 [IU]/L (ref 0–40)
Albumin: 4.5 g/dL (ref 3.9–4.9)
Alkaline Phosphatase: 59 [IU]/L (ref 44–121)
BUN/Creatinine Ratio: 14 (ref 10–24)
BUN: 14 mg/dL (ref 8–27)
Bilirubin Total: 0.7 mg/dL (ref 0.0–1.2)
CO2: 21 mmol/L (ref 20–29)
Calcium: 10.2 mg/dL (ref 8.6–10.2)
Chloride: 101 mmol/L (ref 96–106)
Creatinine, Ser: 0.97 mg/dL (ref 0.76–1.27)
Globulin, Total: 2.9 g/dL (ref 1.5–4.5)
Glucose: 86 mg/dL (ref 70–99)
Potassium: 4.6 mmol/L (ref 3.5–5.2)
Sodium: 139 mmol/L (ref 134–144)
Total Protein: 7.4 g/dL (ref 6.0–8.5)
eGFR: 87 mL/min/{1.73_m2} (ref >59)

## 2023-05-05 LAB — LIPID PANEL
Chol/HDL Ratio: 4.1 {ratio} (ref 0.0–5.0)
Cholesterol, Total: 212 mg/dL — ABNORMAL HIGH (ref 100–199)
HDL: 52 mg/dL (ref >39)
LDL Chol Calc (NIH): 144 mg/dL — ABNORMAL HIGH (ref 0–99)
Triglycerides: 90 mg/dL (ref 0–149)
VLDL Cholesterol Cal: 16 mg/dL (ref 5–40)

## 2023-05-25 IMAGING — DX DG FOOT COMPLETE 3+V*R*
3 series · 3 of 3 positions shown · non-contrast
Comparison: None.

CLINICAL DATA: Acute injury to right foot. Tender to palpation at
fifth metatarsal and cuboid. Patient reports popping injury fell
last night while bowling. Pain laterally.

EXAM:
RIGHT FOOT COMPLETE - 3+ VIEW

[foot ap]
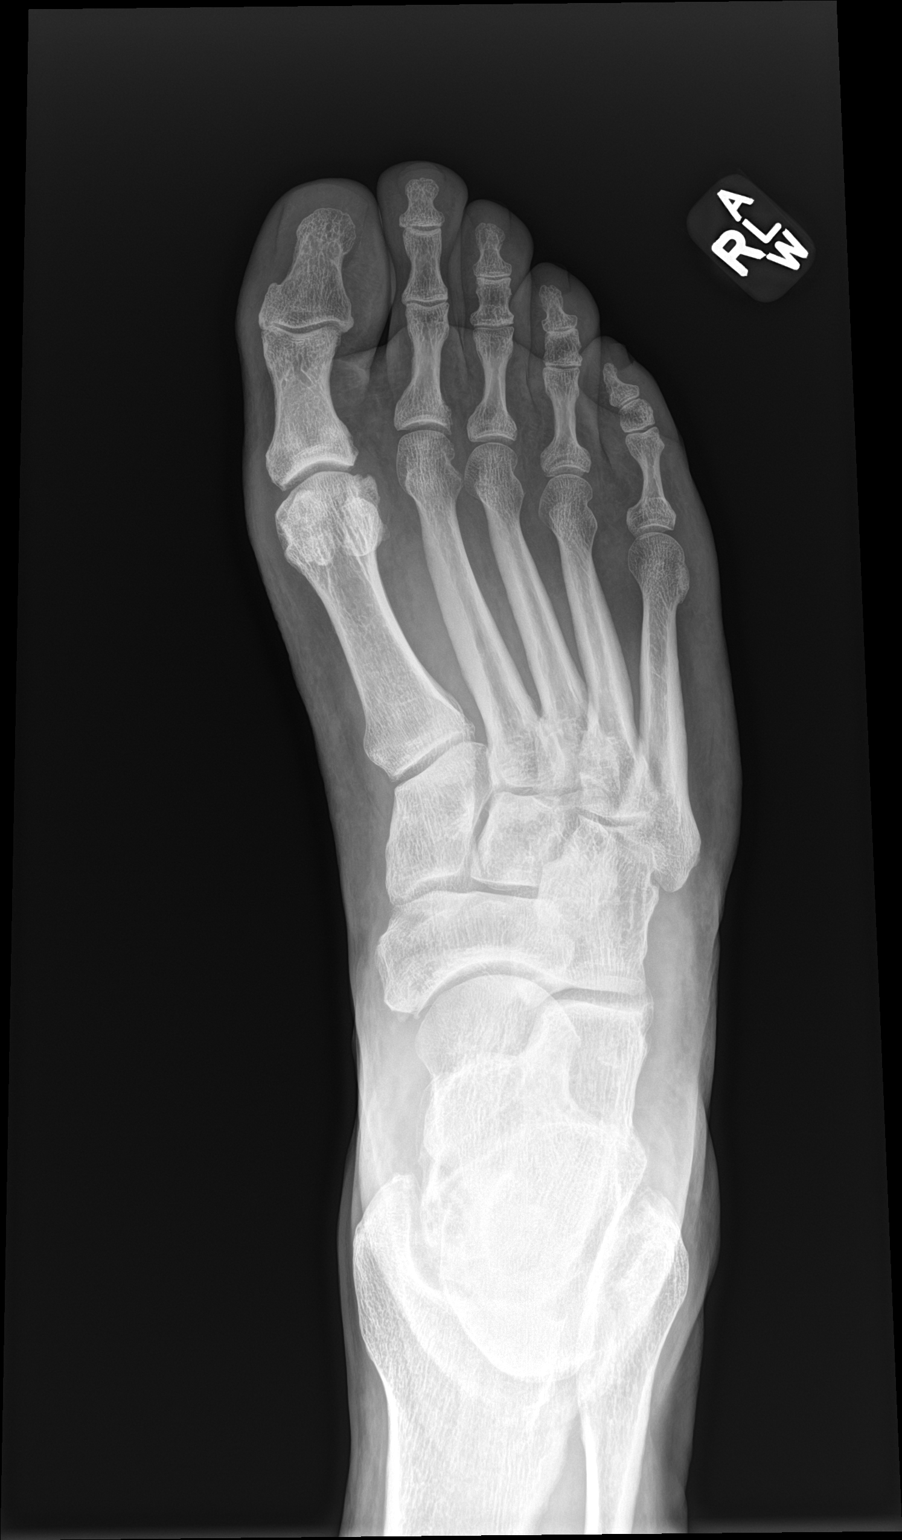

[foot obl]
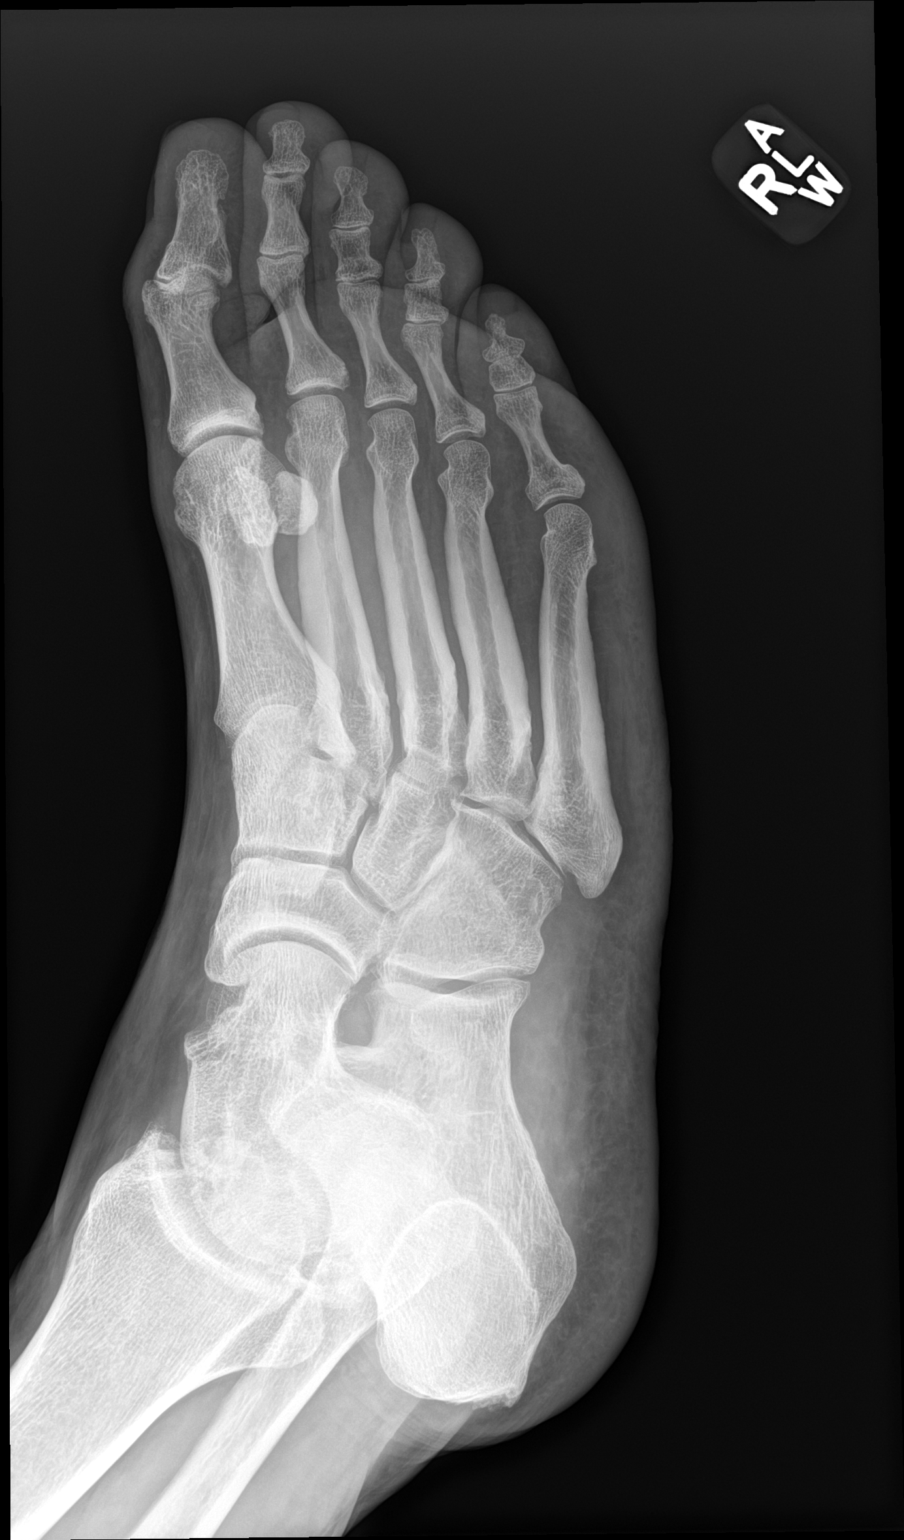

[foot lat]
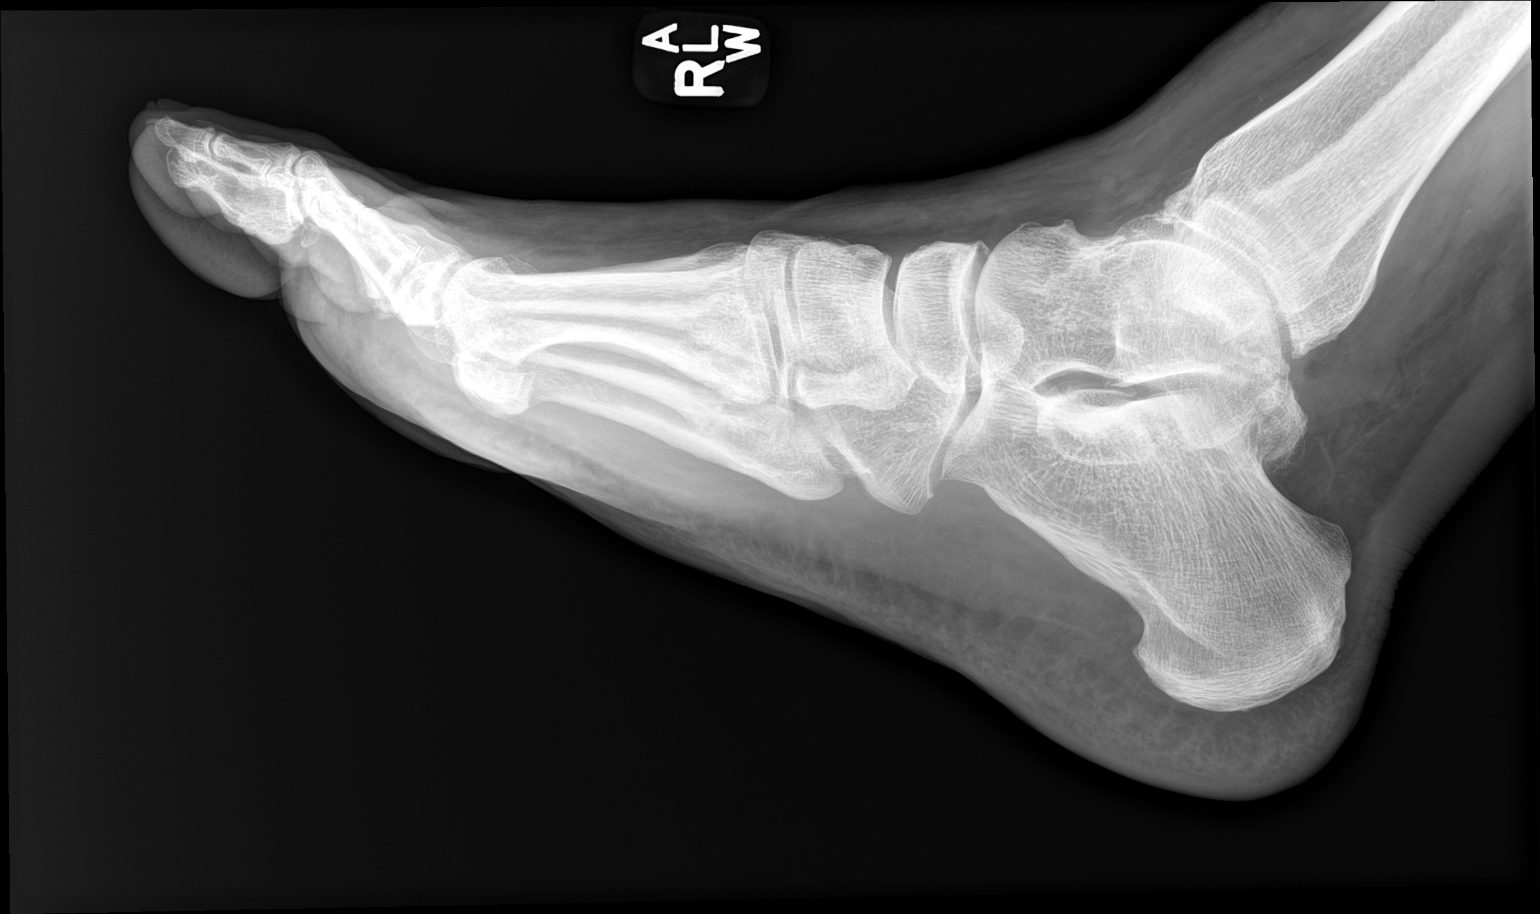

[3 of 3 positions shown; findings below may reference images not displayed]

FINDINGS: There is no evidence of fracture or dislocation. Particularly, no
fracture is seen of the fifth metatarsal or lateral tarsal bones.
Minor degenerative change of the first metatarsal phalangeal joint
and midfoot with spurring. No erosions. Mild dorsal soft tissue
edema.
IMPRESSION: 1. No acute fracture or subluxation of the right foot, with
particular attention to the fifth metatarsal and cuboid.
2. Mild osteoarthritis of the first metatarsophalangeal joint and
midfoot.

## 2024-07-10 ENCOUNTER — Ambulatory Visit: Payer: Self-pay | Admitting: Family Medicine
